# Patient Record
Sex: Male | Born: 1950 | Race: White | Hispanic: No | Marital: Single | Smoking: Never smoker
Health system: Southern US, Community
[De-identification: ages and names within clinical notes are randomized; demographics above are authoritative.]

## PROBLEM LIST (undated history)

## (undated) DIAGNOSIS — M19011 Primary osteoarthritis, right shoulder: Secondary | ICD-10-CM

## (undated) DIAGNOSIS — I10 Essential (primary) hypertension: Secondary | ICD-10-CM

## (undated) DIAGNOSIS — Z87442 Personal history of urinary calculi: Secondary | ICD-10-CM

## (undated) DIAGNOSIS — M1990 Unspecified osteoarthritis, unspecified site: Secondary | ICD-10-CM

## (undated) DIAGNOSIS — K219 Gastro-esophageal reflux disease without esophagitis: Secondary | ICD-10-CM

## (undated) HISTORY — PX: JOINT REPLACEMENT: SHX530

## (undated) HISTORY — PX: OTHER SURGICAL HISTORY: SHX169

---

## 2012-06-12 ENCOUNTER — Other Ambulatory Visit (HOSPITAL_COMMUNITY): Payer: Self-pay | Admitting: Orthopedic Surgery

## 2012-06-12 ENCOUNTER — Ambulatory Visit (HOSPITAL_COMMUNITY)
Admission: RE | Admit: 2012-06-12 | Discharge: 2012-06-12 | Disposition: A | Discharge status: Home / Self Care | Payer: Self-pay | Source: Ambulatory Visit | Attending: Orthopedic Surgery | Admitting: Orthopedic Surgery

## 2012-06-12 DIAGNOSIS — Z1389 Encounter for screening for other disorder: Secondary | ICD-10-CM | POA: Insufficient documentation

## 2012-06-12 DIAGNOSIS — Z77018 Contact with and (suspected) exposure to other hazardous metals: Secondary | ICD-10-CM

## 2017-04-30 ENCOUNTER — Other Ambulatory Visit: Payer: Self-pay | Admitting: Orthopedic Surgery

## 2017-04-30 DIAGNOSIS — M25511 Pain in right shoulder: Secondary | ICD-10-CM

## 2017-05-04 ENCOUNTER — Ambulatory Visit
Admission: RE | Admit: 2017-05-04 | Discharge: 2017-05-04 | Disposition: A | Discharge status: Home / Self Care | Payer: Medicare Other | Source: Ambulatory Visit | Attending: Orthopedic Surgery | Admitting: Orthopedic Surgery

## 2017-05-04 DIAGNOSIS — M25511 Pain in right shoulder: Secondary | ICD-10-CM

## 2017-08-07 ENCOUNTER — Other Ambulatory Visit: Payer: Self-pay | Admitting: Orthopedic Surgery

## 2017-08-14 ENCOUNTER — Inpatient Hospital Stay (HOSPITAL_COMMUNITY): Admission: RE | Admit: 2017-08-14 | Payer: Self-pay | Source: Ambulatory Visit

## 2017-10-02 ENCOUNTER — Encounter (HOSPITAL_COMMUNITY): Payer: Self-pay

## 2017-10-02 ENCOUNTER — Encounter (HOSPITAL_COMMUNITY)
Admission: RE | Admit: 2017-10-02 | Discharge: 2017-10-02 | Disposition: A | Discharge status: Home / Self Care | Payer: BLUE CROSS/BLUE SHIELD | Source: Ambulatory Visit | Attending: Orthopedic Surgery | Admitting: Orthopedic Surgery

## 2017-10-02 ENCOUNTER — Other Ambulatory Visit: Payer: Self-pay

## 2017-10-02 DIAGNOSIS — Z01818 Encounter for other preprocedural examination: Secondary | ICD-10-CM | POA: Diagnosis present

## 2017-10-02 HISTORY — DX: Personal history of urinary calculi: Z87.442

## 2017-10-02 HISTORY — DX: Gastro-esophageal reflux disease without esophagitis: K21.9

## 2017-10-02 HISTORY — DX: Essential (primary) hypertension: I10

## 2017-10-02 HISTORY — DX: Unspecified osteoarthritis, unspecified site: M19.90

## 2017-10-02 LAB — BASIC METABOLIC PANEL
ANION GAP: 9 (ref 5–15)
BUN: 18 mg/dL (ref 6–20)
CO2: 24 mmol/L (ref 22–32)
Calcium: 9.2 mg/dL (ref 8.9–10.3)
Chloride: 105 mmol/L (ref 101–111)
Creatinine, Ser: 1.09 mg/dL (ref 0.61–1.24)
GFR calc Af Amer: >60 mL/min (ref >60)
GLUCOSE: 92 mg/dL (ref 65–99)
POTASSIUM: 4.1 mmol/L (ref 3.5–5.1)
Sodium: 138 mmol/L (ref 135–145)

## 2017-10-02 LAB — CBC
HEMATOCRIT: 42 % (ref 39.0–52.0)
HEMOGLOBIN: 14.3 g/dL (ref 13.0–17.0)
MCH: 29.1 pg (ref 26.0–34.0)
MCHC: 34 g/dL (ref 30.0–36.0)
MCV: 85.5 fL (ref 78.0–100.0)
Platelets: 244 10*3/uL (ref 150–400)
RBC: 4.91 MIL/uL (ref 4.22–5.81)
RDW: 12.2 % (ref 11.5–15.5)
WBC: 10.9 10*3/uL — AB (ref 4.0–10.5)

## 2017-10-02 LAB — SURGICAL PCR SCREEN
MRSA, PCR: NEGATIVE
STAPHYLOCOCCUS AUREUS: NEGATIVE

## 2017-10-02 NOTE — Pre-Procedure Instructions (Signed)
Sergio SpryVernon Jensen  10/02/2017      [facility] [id number] - [place], [place] - [address] AT [facility] [address] [place] [id number] Phone: [phone] Fax: [phone]  [facility] [place] - [address] [place] [id number] Phone: [phone] Fax: [phone]    Your procedure is scheduled on 10-13-2017  Tuesday.  [facility] Admitting at 5:30 A.M .  Call this number if you have problems the morning of surgery:  [phone]   Remember:  Do not eat food or drink liquids after midnight.   Take these medicines the morning of surgery with A SIP OF WATER Nexium if needed  STOP ASPIRIN,ANTIINFLAMATORIES (IBUPROFEN,ALEVE,MOTRIN,ADVIL,GOODY'S POWDERS),HERBAL SUPPLEMENTS,FISH OIL,AND VITAMINS 5-7 DAYS PRIOR TO SURGERY     Do not wear jewelry, make-up or nail polish.  Do not wear lotions, powders, or perfumes, or deoderant.  Do not shave 48 hours prior to surgery.  Men may shave face and neck.  Do not bring valuables to the hospital.  [facility] is not responsible for any belongings or valuables.  Contacts, dentures or bridgework may not be worn into surgery.  Leave your suitcase in the car.  After surgery it may be brought to your room.  For patients admitted to the hospital, discharge time will be determined by your treatment team.  Patients discharged the day of surgery will not be allowed to drive home.    Special Instructions: [place] - Preparing for Surgery  Before surgery, you can play an important role.  Because skin is not sterile, your skin needs to be as free of germs as possible.  You can reduce the number of germs on you skin by washing with CHG (chlorahexidine gluconate) soap before surgery.  CHG is an antiseptic cleaner which kills germs and bonds with the skin to continue killing germs even after washing.  Please DO NOT use if you have  an allergy to CHG or antibacterial soaps.  If your skin becomes reddened/irritated stop using the CHG and inform your nurse when you arrive at Short Stay.  Do not shave (including legs and underarms) for at least 48 hours prior to the first CHG shower.  You may shave your face.  Please follow these instructions carefully:   1.  Shower with CHG Soap the night before surgery and the   morning of Surgery.  2.  If you choose to wash your hair, wash your hair first as usual with your normal shampoo.  3.  After you shampoo, rinse your hair and body thoroughly to remove the  Shampoo.  4.  Use CHG as you would any other liquid soap.  You can apply chg directly  to the skin and wash gently with scrungie or a clean washcloth.  5.  Apply the CHG Soap to your body ONLY FROM THE NECK DOWN.   Do not use on open wounds or open sores.  Avoid contact with your eyes,  ears, mouth and genitals (private parts).  Wash genitals (private parts) with your normal soap.  6.  Wash thoroughly, paying special attention to the area where your surgery will be performed.  7.  Thoroughly rinse your body with warm water from the neck down.  8.  DO NOT shower/wash with your normal soap after using and rinsing o  the CHG Soap.  9.  Pat yourself dry with a clean towel.  10.  Wear clean pajamas.            11.  Place clean sheets on your bed the night of your first shower and do not sleep with pets.  Day of Surgery  Do not apply any lotions/deodorants the morning of surgery.  Please wear clean clothes to the hospital/surgery center.   Please read over the following fact sheets that you were given. Coughing and Deep Breathing, MRSA Information and Surgical Site Infection Prevention,Incentive spirmetry

## 2017-10-02 NOTE — Progress Notes (Signed)
Denies any cardiac history Never seen a cardiologist Denies any cardiac testing

## 2017-10-13 ENCOUNTER — Inpatient Hospital Stay (HOSPITAL_COMMUNITY): Payer: BLUE CROSS/BLUE SHIELD | Admitting: Certified Registered"

## 2017-10-13 ENCOUNTER — Encounter (HOSPITAL_COMMUNITY): Payer: Self-pay | Admitting: *Deleted

## 2017-10-13 ENCOUNTER — Encounter (HOSPITAL_COMMUNITY)
Admission: RE | Disposition: A | Discharge status: Home / Self Care | Payer: Self-pay | Source: Ambulatory Visit | Attending: Orthopedic Surgery

## 2017-10-13 ENCOUNTER — Inpatient Hospital Stay (HOSPITAL_COMMUNITY)
Admission: RE | Admit: 2017-10-13 | Discharge: 2017-10-14 | DRG: 483 | Disposition: A | Discharge status: Home / Self Care | Payer: BLUE CROSS/BLUE SHIELD | Source: Ambulatory Visit | Attending: Orthopedic Surgery | Admitting: Orthopedic Surgery

## 2017-10-13 ENCOUNTER — Inpatient Hospital Stay (HOSPITAL_COMMUNITY): Payer: BLUE CROSS/BLUE SHIELD

## 2017-10-13 DIAGNOSIS — M1990 Unspecified osteoarthritis, unspecified site: Secondary | ICD-10-CM | POA: Diagnosis present

## 2017-10-13 DIAGNOSIS — Z881 Allergy status to other antibiotic agents status: Secondary | ICD-10-CM | POA: Diagnosis not present

## 2017-10-13 DIAGNOSIS — M19019 Primary osteoarthritis, unspecified shoulder: Secondary | ICD-10-CM | POA: Diagnosis present

## 2017-10-13 DIAGNOSIS — M19011 Primary osteoarthritis, right shoulder: Secondary | ICD-10-CM | POA: Diagnosis present

## 2017-10-13 DIAGNOSIS — I10 Essential (primary) hypertension: Secondary | ICD-10-CM | POA: Diagnosis present

## 2017-10-13 DIAGNOSIS — Z87442 Personal history of urinary calculi: Secondary | ICD-10-CM

## 2017-10-13 DIAGNOSIS — Z96619 Presence of unspecified artificial shoulder joint: Secondary | ICD-10-CM

## 2017-10-13 DIAGNOSIS — K219 Gastro-esophageal reflux disease without esophagitis: Secondary | ICD-10-CM | POA: Diagnosis present

## 2017-10-13 DIAGNOSIS — M25511 Pain in right shoulder: Secondary | ICD-10-CM | POA: Diagnosis present

## 2017-10-13 HISTORY — PX: RESECTION DISTAL CLAVICAL: SHX5053

## 2017-10-13 HISTORY — DX: Primary osteoarthritis, right shoulder: M19.011

## 2017-10-13 HISTORY — PX: TOTAL SHOULDER ARTHROPLASTY: SHX126

## 2017-10-13 SURGERY — ARTHROPLASTY, SHOULDER, TOTAL
Anesthesia: General | Laterality: Right

## 2017-10-13 MED ORDER — METOCLOPRAMIDE HCL 5 MG/ML IJ SOLN: 5.0000 mg | Freq: Three times a day (TID) | INTRAMUSCULAR | Status: DC | PRN

## 2017-10-13 MED ORDER — OXYCODONE HCL 5 MG PO TABS: 5.0000 mg | ORAL_TABLET | ORAL | Status: DC | PRN

## 2017-10-13 MED ORDER — MIDAZOLAM HCL 2 MG/2ML IJ SOLN
INTRAMUSCULAR | Status: AC
Start: 2017-10-13 — End: 2017-10-13
  Filled 2017-10-13: qty 2

## 2017-10-13 MED ORDER — SENNA-DOCUSATE SODIUM 8.6-50 MG PO TABS: 2.0000 | ORAL_TABLET | Freq: Every day | ORAL | 1 refills | Status: AC

## 2017-10-13 MED ORDER — PHENOL 1.4 % MT LIQD: 1.0000 | OROMUCOSAL | Status: DC | PRN

## 2017-10-13 MED ORDER — MIDAZOLAM HCL 5 MG/5ML IJ SOLN
INTRAMUSCULAR | Status: DC | PRN
Start: 2017-10-13 — End: 2017-10-13
  Administered 2017-10-13: 2 mg via INTRAVENOUS

## 2017-10-13 MED ORDER — OXYCODONE HCL 5 MG PO TABS
10.0000 mg | ORAL_TABLET | ORAL | Status: DC | PRN
  Administered 2017-10-13 – 2017-10-14 (×3): 10 mg via ORAL
  Filled 2017-10-13 (×3): qty 2

## 2017-10-13 MED ORDER — DEXAMETHASONE SODIUM PHOSPHATE 10 MG/ML IJ SOLN
INTRAMUSCULAR | Status: DC | PRN
  Administered 2017-10-13: 5 mg via INTRAVENOUS

## 2017-10-13 MED ORDER — MENTHOL 3 MG MT LOZG
1.0000 | LOZENGE | OROMUCOSAL | Status: DC | PRN
  Filled 2017-10-13: qty 9

## 2017-10-13 MED ORDER — SUGAMMADEX SODIUM 200 MG/2ML IV SOLN
INTRAVENOUS | Status: AC
Start: 2017-10-13 — End: 2017-10-13
  Filled 2017-10-13: qty 2

## 2017-10-13 MED ORDER — METHOCARBAMOL 500 MG PO TABS: 500.0000 mg | ORAL_TABLET | Freq: Four times a day (QID) | ORAL | Status: DC | PRN

## 2017-10-13 MED ORDER — PROPOFOL 10 MG/ML IV BOLUS
INTRAVENOUS | Status: AC
Start: 2017-10-13 — End: 2017-10-13
  Filled 2017-10-13: qty 20

## 2017-10-13 MED ORDER — ROCURONIUM BROMIDE 10 MG/ML (PF) SYRINGE
PREFILLED_SYRINGE | INTRAVENOUS | Status: AC
  Filled 2017-10-13: qty 5

## 2017-10-13 MED ORDER — BACLOFEN 10 MG PO TABS: 10.0000 mg | ORAL_TABLET | Freq: Three times a day (TID) | ORAL | 0 refills | Status: AC

## 2017-10-13 MED ORDER — ADULT MULTIVITAMIN W/MINERALS CH
1.0000 | ORAL_TABLET | Freq: Every day | ORAL | Status: DC
  Administered 2017-10-13 – 2017-10-14 (×2): 1 via ORAL
  Filled 2017-10-13 (×4): qty 1

## 2017-10-13 MED ORDER — ACETAMINOPHEN 650 MG RE SUPP: 650.0000 mg | RECTAL | Status: DC | PRN

## 2017-10-13 MED ORDER — FENTANYL CITRATE (PF) 250 MCG/5ML IJ SOLN
INTRAMUSCULAR | Status: DC | PRN
  Administered 2017-10-13: 100 ug via INTRAVENOUS
  Administered 2017-10-13: 50 ug via INTRAVENOUS

## 2017-10-13 MED ORDER — ACETAMINOPHEN 325 MG PO TABS: 650.0000 mg | ORAL_TABLET | ORAL | Status: DC | PRN

## 2017-10-13 MED ORDER — IRBESARTAN 300 MG PO TABS
300.0000 mg | ORAL_TABLET | Freq: Every day | ORAL | Status: DC
  Administered 2017-10-14: 300 mg via ORAL
  Filled 2017-10-13 (×2): qty 1

## 2017-10-13 MED ORDER — METHOCARBAMOL 1000 MG/10ML IJ SOLN: 500.0000 mg | Freq: Four times a day (QID) | INTRAVENOUS | Status: DC | PRN

## 2017-10-13 MED ORDER — ONDANSETRON HCL 4 MG/2ML IJ SOLN
INTRAMUSCULAR | Status: AC
  Filled 2017-10-13: qty 2

## 2017-10-13 MED ORDER — PHENYLEPHRINE 40 MCG/ML (10ML) SYRINGE FOR IV PUSH (FOR BLOOD PRESSURE SUPPORT)
PREFILLED_SYRINGE | INTRAVENOUS | Status: AC
  Filled 2017-10-13: qty 20

## 2017-10-13 MED ORDER — HYDROMORPHONE HCL 1 MG/ML IJ SOLN: 0.5000 mg | INTRAMUSCULAR | Status: DC | PRN

## 2017-10-13 MED ORDER — DIPHENHYDRAMINE HCL 12.5 MG/5ML PO ELIX: 12.5000 mg | ORAL_SOLUTION | ORAL | Status: DC | PRN

## 2017-10-13 MED ORDER — DOCUSATE SODIUM 100 MG PO CAPS
100.0000 mg | ORAL_CAPSULE | Freq: Two times a day (BID) | ORAL | Status: DC
  Administered 2017-10-13 – 2017-10-14 (×3): 100 mg via ORAL
  Filled 2017-10-13 (×3): qty 1

## 2017-10-13 MED ORDER — PHENYLEPHRINE HCL 10 MG/ML IJ SOLN
INTRAVENOUS | Status: DC | PRN
  Administered 2017-10-13: 50 ug/min via INTRAVENOUS

## 2017-10-13 MED ORDER — SODIUM CHLORIDE 0.9 % IR SOLN
Status: DC | PRN
  Administered 2017-10-13: 3000 mL

## 2017-10-13 MED ORDER — CEFAZOLIN SODIUM-DEXTROSE 2-4 GM/100ML-% IV SOLN
2.0000 g | INTRAVENOUS | Status: AC
  Administered 2017-10-13: 2 g via INTRAVENOUS
  Filled 2017-10-13: qty 100

## 2017-10-13 MED ORDER — ACETAMINOPHEN 500 MG PO TABS
1000.0000 mg | ORAL_TABLET | Freq: Four times a day (QID) | ORAL | Status: AC
  Administered 2017-10-13 – 2017-10-14 (×4): 1000 mg via ORAL
  Filled 2017-10-13 (×4): qty 2

## 2017-10-13 MED ORDER — SUGAMMADEX SODIUM 200 MG/2ML IV SOLN
INTRAVENOUS | Status: DC | PRN
  Administered 2017-10-13: 200 mg via INTRAVENOUS

## 2017-10-13 MED ORDER — PANTOPRAZOLE SODIUM 40 MG PO PACK
40.0000 mg | PACK | Freq: Every day | ORAL | Status: DC
  Filled 2017-10-13 (×2): qty 20

## 2017-10-13 MED ORDER — ONDANSETRON HCL 4 MG PO TABS: 4.0000 mg | ORAL_TABLET | Freq: Three times a day (TID) | ORAL | 0 refills | Status: AC | PRN

## 2017-10-13 MED ORDER — LYSINE 1000 MG PO TABS: 1000.0000 mg | ORAL_TABLET | Freq: Every day | ORAL | Status: DC

## 2017-10-13 MED ORDER — METOCLOPRAMIDE HCL 5 MG PO TABS: 5.0000 mg | ORAL_TABLET | Freq: Three times a day (TID) | ORAL | Status: DC | PRN

## 2017-10-13 MED ORDER — LIDOCAINE 2% (20 MG/ML) 5 ML SYRINGE
INTRAMUSCULAR | Status: AC
  Filled 2017-10-13: qty 5

## 2017-10-13 MED ORDER — ONDANSETRON HCL 4 MG PO TABS: 4.0000 mg | ORAL_TABLET | Freq: Four times a day (QID) | ORAL | Status: DC | PRN

## 2017-10-13 MED ORDER — LACTATED RINGERS IV SOLN
INTRAVENOUS | Status: DC | PRN
  Administered 2017-10-13 (×2): via INTRAVENOUS

## 2017-10-13 MED ORDER — GLYCOPYRROLATE 0.2 MG/ML IJ SOLN
INTRAMUSCULAR | Status: DC | PRN
  Administered 2017-10-13: 0.2 mg via INTRAVENOUS

## 2017-10-13 MED ORDER — LIDOCAINE 2% (20 MG/ML) 5 ML SYRINGE
INTRAMUSCULAR | Status: DC | PRN
  Administered 2017-10-13: 60 mg via INTRAVENOUS

## 2017-10-13 MED ORDER — ZINC GLUCONATE 30 MG PO TABS: 30.0000 mg | ORAL_TABLET | Freq: Every day | ORAL | Status: DC

## 2017-10-13 MED ORDER — 0.9 % SODIUM CHLORIDE (POUR BTL) OPTIME
TOPICAL | Status: DC | PRN
  Administered 2017-10-13: 1000 mL

## 2017-10-13 MED ORDER — OXYCODONE HCL 5 MG PO TABS: 5.0000 mg | ORAL_TABLET | ORAL | 0 refills | Status: AC | PRN

## 2017-10-13 MED ORDER — ONDANSETRON HCL 4 MG/2ML IJ SOLN
INTRAMUSCULAR | Status: DC | PRN
  Administered 2017-10-13: 4 mg via INTRAVENOUS

## 2017-10-13 MED ORDER — DEXAMETHASONE SODIUM PHOSPHATE 10 MG/ML IJ SOLN
INTRAMUSCULAR | Status: AC
  Filled 2017-10-13: qty 1

## 2017-10-13 MED ORDER — BISACODYL 10 MG RE SUPP
10.0000 mg | Freq: Every day | RECTAL | Status: DC | PRN
Start: 2017-10-13 — End: 2017-10-14

## 2017-10-13 MED ORDER — PROPOFOL 10 MG/ML IV BOLUS
INTRAVENOUS | Status: DC | PRN
  Administered 2017-10-13: 160 mg via INTRAVENOUS

## 2017-10-13 MED ORDER — MAGNESIUM CITRATE PO SOLN: 1.0000 | Freq: Once | ORAL | Status: DC | PRN

## 2017-10-13 MED ORDER — FENTANYL CITRATE (PF) 250 MCG/5ML IJ SOLN
INTRAMUSCULAR | Status: AC
  Filled 2017-10-13: qty 5

## 2017-10-13 MED ORDER — CEFAZOLIN SODIUM-DEXTROSE 1-4 GM/50ML-% IV SOLN
1.0000 g | Freq: Four times a day (QID) | INTRAVENOUS | Status: AC
  Administered 2017-10-13 – 2017-10-14 (×3): 1 g via INTRAVENOUS
  Filled 2017-10-13 (×3): qty 50

## 2017-10-13 MED ORDER — ZOLPIDEM TARTRATE 5 MG PO TABS: 5.0000 mg | ORAL_TABLET | Freq: Every evening | ORAL | Status: DC | PRN

## 2017-10-13 MED ORDER — ONDANSETRON HCL 4 MG/2ML IJ SOLN: 4.0000 mg | Freq: Four times a day (QID) | INTRAMUSCULAR | Status: DC | PRN

## 2017-10-13 MED ORDER — POTASSIUM CHLORIDE IN NACL 20-0.45 MEQ/L-% IV SOLN
INTRAVENOUS | Status: DC
Start: 2017-10-13 — End: 2017-10-14
  Filled 2017-10-13: qty 1000

## 2017-10-13 MED ORDER — POLYETHYLENE GLYCOL 3350 17 G PO PACK: 17.0000 g | PACK | Freq: Every day | ORAL | Status: DC | PRN

## 2017-10-13 MED ORDER — KETOROLAC TROMETHAMINE 15 MG/ML IJ SOLN
7.5000 mg | Freq: Four times a day (QID) | INTRAMUSCULAR | Status: AC
  Administered 2017-10-13 – 2017-10-14 (×4): 7.5 mg via INTRAVENOUS
  Filled 2017-10-13 (×4): qty 1

## 2017-10-13 MED ORDER — SENNA 8.6 MG PO TABS
1.0000 | ORAL_TABLET | Freq: Two times a day (BID) | ORAL | Status: DC
  Administered 2017-10-13 – 2017-10-14 (×3): 8.6 mg via ORAL
  Filled 2017-10-13 (×3): qty 1

## 2017-10-13 MED ORDER — ESTER-C 500-200-60 MG PO TABS: 500.0000 mg | ORAL_TABLET | Freq: Every day | ORAL | Status: DC

## 2017-10-13 MED ORDER — ROCURONIUM BROMIDE 10 MG/ML (PF) SYRINGE
PREFILLED_SYRINGE | INTRAVENOUS | Status: DC | PRN
  Administered 2017-10-13: 10 mg via INTRAVENOUS
  Administered 2017-10-13 (×2): 20 mg via INTRAVENOUS
  Administered 2017-10-13: 50 mg via INTRAVENOUS
  Administered 2017-10-13: 20 mg via INTRAVENOUS
  Administered 2017-10-13: 30 mg via INTRAVENOUS

## 2017-10-13 MED ORDER — ALUM & MAG HYDROXIDE-SIMETH 200-200-20 MG/5ML PO SUSP: 30.0000 mL | ORAL | Status: DC | PRN

## 2017-10-13 SURGICAL SUPPLY — 57 items
BIT DRILL 5/64X5 DISP (BIT) ×3 IMPLANT
BIT DRILL 7/64X5 DISP (BIT) ×3 IMPLANT
BLADE SAGITTAL (BLADE) ×2
BLADE SAW SAG 9X24 (BLADE) ×3 IMPLANT
BLADE SAW THK.89X75X18XSGTL (BLADE) ×1 IMPLANT
CAPT SHLDR TOTAL 2 ×3 IMPLANT
CEMENT BONE R 1X40 (Cement) ×3 IMPLANT
CLOSURE STERI-STRIP 1/2X4 (GAUZE/BANDAGES/DRESSINGS)
CLOSURE STERI-STRIP 1/4X4 (GAUZE/BANDAGES/DRESSINGS) ×3 IMPLANT
CLSR STERI-STRIP ANTIMIC 1/2X4 (GAUZE/BANDAGES/DRESSINGS) IMPLANT
COVER SURGICAL LIGHT HANDLE (MISCELLANEOUS) ×3 IMPLANT
DRAPE HALF SHEET 40X57 (DRAPES) ×3 IMPLANT
DRAPE ORTHO SPLIT 77X108 STRL (DRAPES) ×4
DRAPE SURG ORHT 6 SPLT 77X108 (DRAPES) ×2 IMPLANT
DRAPE U-SHAPE 47X51 STRL (DRAPES) ×3 IMPLANT
DRSG MEPILEX BORDER 4X8 (GAUZE/BANDAGES/DRESSINGS) ×3 IMPLANT
DRSG MEPITEL 4X7.2 (GAUZE/BANDAGES/DRESSINGS) ×3 IMPLANT
DURAPREP 26ML APPLICATOR (WOUND CARE) ×3 IMPLANT
ELECT REM PT RETURN 9FT ADLT (ELECTROSURGICAL) ×3
ELECTRODE REM PT RTRN 9FT ADLT (ELECTROSURGICAL) ×1 IMPLANT
GLOVE BIOGEL PI ORTHO PRO SZ8 (GLOVE) ×4
GLOVE ORTHO TXT STRL SZ7.5 (GLOVE) ×3 IMPLANT
GLOVE PI ORTHO PRO STRL SZ8 (GLOVE) ×2 IMPLANT
GLOVE SURG ORTHO 8.0 STRL STRW (GLOVE) ×3 IMPLANT
GOWN STRL REUS W/ TWL XL LVL3 (GOWN DISPOSABLE) ×1 IMPLANT
GOWN STRL REUS W/TWL 2XL LVL3 (GOWN DISPOSABLE) ×3 IMPLANT
GOWN STRL REUS W/TWL XL LVL3 (GOWN DISPOSABLE) ×2
HANDPIECE INTERPULSE COAX TIP (DISPOSABLE) ×2
HOOD PEEL AWAY FACE SHEILD DIS (HOOD) ×6 IMPLANT
HOOD PEEL AWAY FLYTE STAYCOOL (MISCELLANEOUS) ×3 IMPLANT
KIT BASIN OR (CUSTOM PROCEDURE TRAY) ×3 IMPLANT
KIT ROOM TURNOVER OR (KITS) ×3 IMPLANT
MANIFOLD NEPTUNE II (INSTRUMENTS) ×3 IMPLANT
NS IRRIG 1000ML POUR BTL (IV SOLUTION) ×3 IMPLANT
PACK SHOULDER (CUSTOM PROCEDURE TRAY) ×3 IMPLANT
PAD ARMBOARD 7.5X6 YLW CONV (MISCELLANEOUS) ×6 IMPLANT
PIN HUMERAL STMN 3.2MMX9IN (INSTRUMENTS) ×3 IMPLANT
SET HNDPC FAN SPRY TIP SCT (DISPOSABLE) ×1 IMPLANT
SLING ARM FOAM STRAP XLG (SOFTGOODS) ×3 IMPLANT
SLING ARM IMMOBILIZER LRG (SOFTGOODS) IMPLANT
SLING ARM IMMOBILIZER MED (SOFTGOODS) IMPLANT
SMARTMIX MINI TOWER (MISCELLANEOUS) ×3
SUCTION FRAZIER HANDLE 10FR (MISCELLANEOUS) ×2
SUCTION TUBE FRAZIER 10FR DISP (MISCELLANEOUS) ×1 IMPLANT
SUPPORT WRAP ARM LG (MISCELLANEOUS) ×3 IMPLANT
SUT FIBERWIRE #2 38 REV NDL BL (SUTURE) ×15
SUT VIC AB 0 CT1 27 (SUTURE) ×2
SUT VIC AB 0 CT1 27XBRD ANBCTR (SUTURE) ×1 IMPLANT
SUT VIC AB 2-0 CT1 27 (SUTURE) ×2
SUT VIC AB 2-0 CT1 TAPERPNT 27 (SUTURE) ×1 IMPLANT
SUT VIC AB 3-0 SH 8-18 (SUTURE) ×3 IMPLANT
SUTURE FIBERWR#2 38 REV NDL BL (SUTURE) ×5 IMPLANT
TOWER CARTRIDGE SMART MIX (DISPOSABLE) ×3 IMPLANT
TOWER SMARTMIX MINI (MISCELLANEOUS) ×1 IMPLANT
TUBE CONNECTING 12'X1/4 (SUCTIONS)
TUBE CONNECTING 12X1/4 (SUCTIONS) IMPLANT
YANKAUER SUCT BULB TIP NO VENT (SUCTIONS) ×3 IMPLANT

## 2017-10-13 NOTE — Anesthesia Procedure Notes (Signed)
Procedure Name: Intubation Date/Time: 10/13/2017 7:44 AM Performed by: Freddie Breech, CRNA Pre-anesthesia Checklist: Patient identified, Emergency Drugs available, Suction available and Patient being monitored Patient Re-evaluated:Patient Re-evaluated prior to induction Oxygen Delivery Method: Circle System Utilized Preoxygenation: Pre-oxygenation with 100% oxygen Induction Type: IV induction Ventilation: Mask ventilation without difficulty Laryngoscope Size: Mac and 4 Grade View: Grade III Tube type: Oral Tube size: 7.5 mm Number of attempts: 1 Airway Equipment and Method: Stylet and Oral airway Placement Confirmation: ETT inserted through vocal cords under direct vision,  positive ETCO2 and breath sounds checked- equal and bilateral Secured at: 22 cm Tube secured with: Tape Dental Injury: Teeth and Oropharynx as per pre-operative assessment

## 2017-10-13 NOTE — Anesthesia Preprocedure Evaluation (Signed)
Anesthesia Evaluation  Patient identified by MRN, date of birth, ID band Patient awake    Reviewed: Allergy & Precautions, NPO status , Patient's Chart, lab work & pertinent test results  Airway Mallampati: I  TM Distance: >3 FB Neck ROM: Full    Dental   Pulmonary    Pulmonary exam normal        Cardiovascular hypertension, Pt. on medications Normal cardiovascular exam     Neuro/Psych    GI/Hepatic GERD  Medicated and Controlled,  Endo/Other    Renal/GU      Musculoskeletal   Abdominal   Peds  Hematology   Anesthesia Other Findings   Reproductive/Obstetrics                             Anesthesia Physical Anesthesia Plan  ASA: II  Anesthesia Plan: General   Post-op Pain Management:  Regional for Post-op pain   Induction: Intravenous  PONV Risk Score and Plan: 2 and Dexamethasone, Ondansetron and Midazolam  Airway Management Planned: Oral ETT  Additional Equipment:   Intra-op Plan:   Post-operative Plan: Extubation in OR  Informed Consent: I have reviewed the patients History and Physical, chart, labs and discussed the procedure including the risks, benefits and alternatives for the proposed anesthesia with the patient or authorized representative who has indicated his/her understanding and acceptance.     Plan Discussed with: Surgeon and CRNA  Anesthesia Plan Comments:         Anesthesia Quick Evaluation

## 2017-10-13 NOTE — Evaluation (Signed)
Physical Therapy Evaluation and discharge  Patient Details Name: Sergio SpryVernon Prichett MRN: [id number] DOB: Mar 31, 1951 Today's Date: 10/13/2017   History of Present Illness  Pt is a 66 y/o male s/p R total shoulder arthroplasty. PMH includes HTN and L TKA.   Clinical Impression  Patient evaluated by Physical Therapy with no further acute PT needs identified. All education has been completed and the patient has no further questions. Pt steady with gait and stair navigation and required supervision. Will have support from family at home. Follow up recommendations per MD arrangements. See below for any follow-up Physical Therapy or equipment needs. PT is signing off. Thank you for this referral. If needs change, please re-consult.      Follow Up Recommendations DC plan and follow up therapy as arranged by surgeon    Equipment Recommendations  None recommended by PT    Recommendations for Other Services       Precautions / Restrictions Precautions Precautions: Shoulder Type of Shoulder Precautions: total shoulder  Shoulder Interventions: Shoulder sling/immobilizer;Off for dressing/bathing/exercises Precaution Booklet Issued: No Precaution Comments: No PROM/AROM at shoulder  Required Braces or Orthoses: Sling Restrictions Weight Bearing Restrictions: Yes RUE Weight Bearing: Non weight bearing      Mobility  Bed Mobility Overal bed mobility: Modified Independent             General bed mobility comments: Increased time, but no assist required.   Transfers Overall transfer level: Modified independent Equipment used: None             General transfer comment: Steady transfer. No assist requried. Did require safety cues to wait for PT.   Ambulation/Gait Ambulation/Gait assistance: Supervision Ambulation Distance (Feet): 400 Feet Assistive device: None Gait Pattern/deviations: WFL(Within Functional Limits) Gait velocity: WFL Gait velocity interpretation: at or above  normal speed for age/gender General Gait Details: Overall steady gait. Cues to slow gait speed to increase safety with mobility. No LOB noted.   Stairs Stairs: Yes Stairs assistance: Supervision Stair Management: One rail Right;Step to pattern;Forwards Number of Stairs: 1 General stair comments: Practiced 1 step management X 3. Supervision for safety. Educated about assist required at home to increase safety. Pt felt safe with stair navigation.   Wheelchair Mobility    Modified Rankin (Stroke Patients Only)       Balance Overall balance assessment: Needs assistance Sitting-balance support: No upper extremity supported;Feet supported Sitting balance-Leahy Scale: Good     Standing balance support: During functional activity;No upper extremity supported Standing balance-Leahy Scale: Good                               Pertinent Vitals/Pain Pain Assessment: Faces Faces Pain Scale: Hurts a little bit Pain Location: R shoulder  Pain Descriptors / Indicators: Aching;Operative site guarding Pain Intervention(s): Limited activity within patient's tolerance;Monitored during session;Repositioned    Home Living Family/patient expects to be discharged to:: Private residence Living Arrangements: Spouse/significant other Available Help at Discharge: Family;Available 24 hours/day Type of Home: House Home Access: Stairs to enter Entrance Stairs-Rails: None Entrance Stairs-Number of Steps: 1 Home Layout: Two level;Laundry or work area in basement;Able to live on main level with bedroom/bathroom Home Equipment: Environmental consultantWalker - 2 wheels;Crutches      Prior Function Level of Independence: Independent               Hand Dominance        Extremity/Trunk Assessment   Upper Extremity Assessment Upper Extremity Assessment:  RUE deficits/detail RUE Deficits / Details: RUE in sling throughout session. Able to wiggle fingers and wrist.     Lower Extremity Assessment Lower  Extremity Assessment: Overall WFL for tasks assessed    Cervical / Trunk Assessment Cervical / Trunk Assessment: Normal  Communication   Communication: No difficulties  Cognition Arousal/Alertness: Awake/alert Behavior During Therapy: WFL for tasks assessed/performed Overall Cognitive Status: Within Functional Limits for tasks assessed                                        General Comments General comments (skin integrity, edema, etc.): Educated about moving fingers and wrist for exercise program. Educated about pacing himself at home to increase safety with mobility.     Exercises     Assessment/Plan    PT Assessment Patent does not need any further PT services  PT Problem List         PT Treatment Interventions      PT Goals (Current goals can be found in the Care Plan section)  Acute Rehab PT Goals Patient Stated Goal: to go home  PT Goal Formulation: With patient/family Time For Goal Achievement: 10/13/17 Potential to Achieve Goals: Good    Frequency     Barriers to discharge        Co-evaluation               AM-PAC PT "6 Clicks" Daily Activity  Outcome Measure Difficulty turning over in bed (including adjusting bedclothes, sheets and blankets)?: None Difficulty moving from lying on back to sitting on the side of the bed? : None Difficulty sitting down on and standing up from a chair with arms (e.g., wheelchair, bedside commode, etc,.)?: None Help needed moving to and from a bed to chair (including a wheelchair)?: None Help needed walking in hospital room?: None Help needed climbing 3-5 steps with a railing? : None 6 Click Score: 24    End of Session Equipment Utilized During Treatment: Gait belt;Other (comment)(sling RUE ) Activity Tolerance: Patient tolerated treatment well Patient left: with family/visitor present;Other (comment)(walking with family; cleared with RN ) Nurse Communication: Mobility status(cleared for walking with  family ) PT Visit Diagnosis: Other abnormalities of gait and mobility (R26.89);Pain Pain - Right/Left: Right Pain - part of body: Shoulder    Time: [id number] PT Time Calculation (min) (ACUTE ONLY): 25 min   Charges:   PT Evaluation $PT Eval Low Complexity: 1 Low PT Treatments $Gait Training: 8-22 mins   PT G Codes:        Gladys DammeBrittany Izamar Linden, PT, DPT  Acute Rehabilitation Services  Pager: [phone]   Lehman PromBrittany S Dallas Torok 10/13/2017, 3:44 PM

## 2017-10-13 NOTE — Progress Notes (Signed)
PHARMACIST - PHYSICIAN ORDER COMMUNICATION  CONCERNING: P&T Medication Policy on Herbal Medications  DESCRIPTION:  This patient's order for:  Lysine 1000 mg, Ester-C, Zinc gluconate 30 mg has been noted.  This product(s) is classified as an "herbal" or natural product. Due to a lack of definitive safety studies or FDA approval, nonstandard manufacturing practices, plus the potential risk of unknown drug-drug interactions while on inpatient medications, the Pharmacy and Therapeutics Committee does not permit the use of "herbal" or natural products of this type within [facility].   ACTION TAKEN: The pharmacy department is unable to verify this order at this time and your patient has been informed of this safety policy. Please reevaluate patient's clinical condition at discharge and address if the herbal or natural product(s) should be resumed at that time.

## 2017-10-13 NOTE — Anesthesia Postprocedure Evaluation (Signed)
Anesthesia Post Note  Patient: Melanee SpryVernon Shave  Procedure(s) Performed: RIGHT TOTAL SHOULDER ARTHROPLASTY (Right ) RESECTION DISTAL CLAVICAL (Right )     Patient location during evaluation: PACU Anesthesia Type: General Level of consciousness: awake and alert Pain management: pain level controlled Vital Signs Assessment: post-procedure vital signs reviewed and stable Respiratory status: spontaneous breathing, nonlabored ventilation, respiratory function stable and patient connected to nasal cannula oxygen Cardiovascular status: blood pressure returned to baseline and stable Postop Assessment: no apparent nausea or vomiting Anesthetic complications: no    Last Vitals:  Vitals:   10/13/17 1140 10/13/17 1204  BP: (!) 108/59 114/67  Pulse: 61 (!) 54  Resp: 17 16  Temp: 36.6 C 36.6 C  SpO2: 100% 99%    Last Pain:  Vitals:   10/13/17 1315  TempSrc:   PainSc: 4                  Elinda Bunten DAVID

## 2017-10-13 NOTE — Plan of Care (Signed)
  Completed/Met Education: Knowledge of the prescribed therapeutic regimen will improve 10/13/2017 2013 - Completed/Met by Charlena Cross, RN Understanding of activity limitations/precautions following surgery will improve 10/13/2017 2013 - Completed/Met by Pricilla Holm, July D, RN Pain Management: Pain level will decrease with appropriate interventions 10/13/2017 2013 - Completed/Met by Charlena Cross, RN

## 2017-10-13 NOTE — H&P (Signed)
PREOPERATIVE H&P  Chief Complaint: djd right shoulder  HPI: Sergio Jensen is a 66 y.o. male who presents for preoperative history and physical with a diagnosis of djd right shoulder. Symptoms are rated as moderate to severe, and have been worsening.  This is significantly impairing activities of daily living.  He has elected for surgical management.   He has failed injections, activity modification, anti-inflammatories, and assistive devices.  Preoperative X-rays demonstrate end stage degenerative changes with osteophyte formation, loss of joint space, subchondral sclerosis.  He has also localized significant pain over his acromioclavicular joint.  Past Medical History:  Diagnosis Date  . Arthritis   . GERD (gastroesophageal reflux disease)   . History of kidney stones   . Hypertension    Past Surgical History:  Procedure Laterality Date  . JOINT REPLACEMENT Left    knee  . penile implsnt     7 weeks ago   Social History   Socioeconomic History  . Marital status: Single    Spouse name: None  . Number of children: None  . Years of education: None  . Highest education level: None  Social Needs  . Financial resource strain: None  . Food insecurity - worry: None  . Food insecurity - inability: None  . Transportation needs - medical: None  . Transportation needs - non-medical: None  Occupational History  . None  Tobacco Use  . Smoking status: Never Smoker  . Smokeless tobacco: Never Used  Substance and Sexual Activity  . Alcohol use: Yes    Comment: rarely  . Drug use: No  . Sexual activity: None  Other Topics Concern  . None  Social History Narrative  . None   History reviewed. No pertinent family history. Allergies  Allergen Reactions  . Erythromycin Other (See Comments)    Severe stomach cramps   Prior to Admission medications   Medication Sig Start Date End Date Taking? Authorizing Provider  Bioflavonoid Products (ESTER-C) 500-200-60 MG TABS Take 500 mg  by mouth daily.   Yes [provider]  esomeprazole (NEXIUM) 40 MG capsule Take 40 mg by mouth daily as needed.   Yes [provider]  irbesartan (AVAPRO) 300 MG tablet Take 300 mg by mouth daily.   Yes [provider]  Lysine 1000 MG TABS Take 1,000 mg by mouth daily.   Yes [provider]  Multiple Vitamins-Minerals (MULTIVITAMIN WITH MINERALS) tablet Take 1 tablet by mouth daily. Thrive   Yes [provider]  naproxen sodium (ALEVE) 220 MG tablet Take 440 mg by mouth at bedtime as needed (for 3 consecutive nights).   Yes [provider]  testosterone cypionate (DEPOTESTOTERONE CYPIONATE) 100 MG/ML injection Inject 200 mg into the muscle every 14 (fourteen) days. For IM use only   Yes [provider]  Zinc Gluconate 30 MG TABS Take 30 mg by mouth daily.   Yes [provider]     Positive ROS: All other systems have been reviewed and were otherwise negative with the exception of those mentioned in the HPI and as above.  Physical Exam: General: Alert, no acute distress Cardiovascular: No pedal edema Respiratory: No cyanosis, no use of accessory musculature GI: No organomegaly, abdomen is soft and non-tender Skin: No lesions in the area of chief complaint Neurologic: Sensation intact distally Psychiatric: Patient is competent for consent with normal mood and affect Lymphatic: No axillary or cervical lymphadenopathy  MUSCULOSKELETAL: Right shoulder has active motion 0-120 degrees with positive pain over his acromioclavicular joint.  Assessment: djd right shoulder   Plan: Plan for Procedure(s): RIGHT TOTAL SHOULDER ARTHROPLASTY RESECTION DISTAL CLAVICAL  The risks benefits and alternatives were discussed with the patient including but not limited to the risks of nonoperative treatment, versus surgical intervention including infection, bleeding, nerve injury,  blood clots, cardiopulmonary complications, morbidity,  mortality, among others, and they were willing to proceed.   Eulas PostJoshua P Willy Vorce, MD Cell [phone]   10/13/2017 7:12 AM

## 2017-10-13 NOTE — Discharge Instructions (Signed)

## 2017-10-13 NOTE — Transfer of Care (Signed)
Immediate Anesthesia Transfer of Care Note  Patient: Sergio Jensen  Procedure(s) Performed: RIGHT TOTAL SHOULDER ARTHROPLASTY (Right ) RESECTION DISTAL CLAVICAL (Right )  Patient Location: PACU  Anesthesia Type:GA combined with regional for post-op pain  Level of Consciousness: drowsy and patient cooperative  Airway & Oxygen Therapy: Patient Spontanous Breathing and Patient connected to face mask oxygen  Post-op Assessment: Report given to RN and Post -op Vital signs reviewed and stable  Post vital signs: Reviewed and stable  Last Vitals:  Vitals:   10/13/17 0540 10/13/17 1053  BP: (!) 172/83 (!) 126/47  Pulse: 60 64  Resp: 20 20  Temp: 36.6 C 36.6 C  SpO2: 97% 100%    Last Pain:  Vitals:   10/13/17 0540  TempSrc: Oral      Patients Stated Pain Goal: 4 (10/13/17 0551)  Complications: No apparent anesthesia complications

## 2017-10-13 NOTE — Op Note (Signed)
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  POST-OPERATIVE DIAGNOSIS:  Same  PROCEDURE:  RIGHT TOTAL SHOULDER ARTHROPLASTY, with open resection of distal clavicle  SURGEON:  Eulas PostJoshua P Lliam Hoh, MD  PHYSICIAN ASSISTANT: Janace LittenBrandon Parry, OPA-C, present and scrubbed throughout the case, critical for completion in a timely fashion, and for retraction, instrumentation, and closure.  ANESTHESIA:   General with an interscalene block  ESTIMATED BLOOD LOSS: 250 ml  PREOPERATIVE INDICATIONS:  Sergio Jensen is a  66 y.o. male with a diagnosis of djd right shoulder, including both the glenohumeral and acromioclavicular joint with pain in both locations, who failed conservative measures and elected for surgical management.    The risks benefits and alternatives were discussed with the patient preoperatively including but not limited to the risks of infection, bleeding, nerve injury, cardiopulmonary complications, the need for revision surgery, dislocation, loosening, incomplete relief of pain, among others, and the patient was willing to proceed.   OPERATIVE IMPLANTS: Biomet size 12 micro press-fit humeral stem, size 46+18 versa-dial humeral head, set in the B position with increased coverage posteriorly, with a medium cemented glenoid polyethylene 3 peg implant with a central regenerex noncemented post.   OPERATIVE FINDINGS: Advanced glenohumeral osteoarthritis involving the glenoid and the humeral head with substantial osteophyte formation inferiorly.  He also had cystic changes and advanced arthritic disease in his acromioclavicular joint.  Subscapularis was intact.  Rotator cuff is intact.   OPERATIVE PROCEDURE: The patient was brought to the operating room and placed in the supine position. General anesthesia was administered. IV antibiotics were given.   The upper extremity was prepped and draped in usual sterile fashion. The patient was in a beachchair position with all bony prominences padded.   Time out was performed and a deltopectoral approach was carried out.  I placed the incision slightly lateral to my normal incision bringing it up over the acromioclavicular joint.  I exposed the acromioclavicular joint with an in-line incision over the joint through the fascia, and then used an oscillating saw to remove 10 mm of distal clavicular bone.  I then repaired the fascia with 0 Vicryl after irrigating it copiously.  I then went back to the deltopectoral interval more inferiorly, the biceps tendon was tenodesed to the pectoralis tendon. The subscapularis was released, tagging it with a #2 FiberWire, leaving a cuff of tendon for repair.   The inferior osteophyte was removed, and release of the capsule off of the humeral side was completed. The head was dislocated, and I reamed sequentially. I placed the humeral cutting guide at 30 of retroversion, and then pinned this into place, and made my humeral neck cut. This was at the appropriate level.   I then placed deep retractors and exposed the glenoid. I excised the labrum circumferentially, taking care to protect the axillary nerve inferiorly.   I then placed a guidewire into the center position, controlling appropriate version and inclination. I then reamed over the guidewire with the small reamer, and was satisfied with the preparation. I preserved the subchondral bone in order to maximize the strength and minimize the risk for subsequent subsidence.   I then drilled the central hole for the regenerex peg, and then placed the guide, and then drilled the 3 peripheral peg holes. I had excellent bony circumferential contact. The anterior hole was bicortical, and all other holes were contained within the vault.  I then cleaned the glenoid, irrigated it copiously, and then dried it and cemented the  prosthesis into place. Excellent seating was achieved. I had full exposure. The cement cured while I turned my attention to the humeral side.   I sequentially broached, up to the selected size, with the broach set at 30 of retroversion. I placed 3 #2 Fiberwire through the bone for subsequent repair.  I then placed the real stem. I trialed with multiple heads, and the above-named component was selected. Increased posterior coverage improved the coverage. The soft tissue tension was appropriate.   I then impacted the real humeral head into place, reduced the head, and irrigated copiously. Excellent stability and range of motion was achieved. I repaired the subscapularis with a total of 5 #2 FiberWire; one for the interval, one for the corner, and then the remaining three from the lesser tuberosity which had already been passed.  Excellent repair achieved and I irrigated copiously once more. The subcutaneous tissue was closed with Vicryl including the deltopectoral fascia.   The skin was closed with Steri-Strips and sterile gauze was applied. He had a preoperative nerve block. He tolerated the procedure well and there were no complications.

## 2017-10-13 NOTE — Anesthesia Procedure Notes (Signed)
Performed by: Lorenza Winkleman Dane, CRNA       

## 2017-10-14 ENCOUNTER — Encounter (HOSPITAL_COMMUNITY): Payer: Self-pay | Admitting: Orthopedic Surgery

## 2017-10-14 LAB — CBC
HEMATOCRIT: 34.5 % — AB (ref 39.0–52.0)
HEMOGLOBIN: 11.6 g/dL — AB (ref 13.0–17.0)
MCH: 28.9 pg (ref 26.0–34.0)
MCHC: 33.6 g/dL (ref 30.0–36.0)
MCV: 86 fL (ref 78.0–100.0)
Platelets: 172 10*3/uL (ref 150–400)
RBC: 4.01 MIL/uL — ABNORMAL LOW (ref 4.22–5.81)
RDW: 12.4 % (ref 11.5–15.5)
WBC: 14.6 10*3/uL — AB (ref 4.0–10.5)

## 2017-10-14 LAB — BASIC METABOLIC PANEL
ANION GAP: 8 (ref 5–15)
BUN: 27 mg/dL — ABNORMAL HIGH (ref 6–20)
CALCIUM: 8.4 mg/dL — AB (ref 8.9–10.3)
CO2: 25 mmol/L (ref 22–32)
Chloride: 103 mmol/L (ref 101–111)
Creatinine, Ser: 1.3 mg/dL — ABNORMAL HIGH (ref 0.61–1.24)
GFR calc non Af Amer: 56 mL/min — ABNORMAL LOW (ref >60)
Glucose, Bld: 123 mg/dL — ABNORMAL HIGH (ref 65–99)
Potassium: 3.9 mmol/L (ref 3.5–5.1)
Sodium: 136 mmol/L (ref 135–145)

## 2017-10-14 NOTE — Evaluation (Signed)
Occupational Therapy Evaluation Patient Details Name: Sergio Jensen MRN: [id number] DOB: 1950/12/29 Today's Date: 10/14/2017    History of Present Illness Pt is a 66 y/o male s/p R total shoulder arthroplasty. PMH includes HTN and L TKA.    Clinical Impression   This 66 y/o M presents with the above. At baseline Pt is independent with ADLs and functional mobility. Pt reports he will be staying with his fiance' for the first two weeks after discharge for assistance with ADLs PRN. Pt completed room level functional mobility with supervision this session, requires MinA for UB ADLs secondary to RUE functional limitations. Reviewed and educated Pt on shoulder protocol, compensatory techniques for completing ADLs while adhering to precautions with Pt verbalizing and return demonstrating understanding. Feel Pt will safely return home with available assist from caregiver PRN until follow-up as arranged by MD. Questions answered throughout. No further acute OT needs identified at this time. Will sign off.     Follow Up Recommendations  DC plan and follow up therapy as arranged by surgeon;Supervision/Assistance - 24 hour    Equipment Recommendations  None recommended by OT           Precautions / Restrictions Precautions Precautions: Shoulder Type of Shoulder Precautions: total shoulder; conservative protocol: NWB RUE, No PROM/AROM at shoulder, okay for AROM to elbow, wrist and hand (to tolerance) Shoulder Interventions: Shoulder sling/immobilizer;Off for dressing/bathing/exercises Precaution Booklet Issued: Yes (comment) Precaution Comments: handout issued and reviewed Required Braces or Orthoses: Sling Restrictions Weight Bearing Restrictions: Yes RUE Weight Bearing: Non weight bearing      Mobility Bed Mobility               General bed mobility comments: OOB in recliner upon arrival   Transfers Overall transfer level: Modified independent Equipment used: None              General transfer comment: Steady transfer. No assist required.     Balance Overall balance assessment: Needs assistance Sitting-balance support: No upper extremity supported;Feet supported Sitting balance-Leahy Scale: Good     Standing balance support: During functional activity;No upper extremity supported Standing balance-Leahy Scale: Good                             ADL either performed or assessed with clinical judgement   ADL Overall ADL's : Needs assistance/impaired Eating/Feeding: Set up;Sitting   Grooming: Set up;Sitting   Upper Body Bathing: Minimal assistance;Sitting Upper Body Bathing Details (indicate cue type and reason): educated on positioning of UE during bathing and method for washing under RUE; educated on safety during task completion Lower Body Bathing: Min guard;Sit to/from stand   Upper Body Dressing : Minimal assistance;Sitting;Cueing for UE precautions;Cueing for sequencing Upper Body Dressing Details (indicate cue type and reason): educated on proper technique for donning overhead and button up shirt; educated Pt on how to don sling without assist with Pt return demonstrating with minA and verbal cues for safety;adhering to shoulder precautions  Lower Body Dressing: Minimal assistance;Sit to/from stand   Toilet Transfer: Min guard;Ambulation;Regular Teacher, adult education Details (indicate cue type and reason): simulated in transfer to/from recliner  Toileting- Architect and Hygiene: Min guard;Sit to/from stand       Functional mobility during ADLs: Min guard General ADL Comments: edcuated Pt on shoulder precautions, compensatory techniques for completing ADLs while adhering to precautions, and HEP for elbow, wrist and digits; min verbal cues for safety/adherence to shoulder precautions during session  with Pt demonstrating good carryover and understanding                          Pertinent Vitals/Pain Pain Assessment:  Faces Faces Pain Scale: Hurts a little bit Pain Location: R shoulder  Pain Descriptors / Indicators: Aching;Operative site guarding Pain Intervention(s): Monitored during session;Limited activity within patient's tolerance     Hand Dominance Right   Extremity/Trunk Assessment Upper Extremity Assessment Upper Extremity Assessment: RUE deficits/detail RUE Deficits / Details: s/p R TSA; elbow, wrist, and digit ROM WFL    Lower Extremity Assessment Lower Extremity Assessment: Overall WFL for tasks assessed   Cervical / Trunk Assessment Cervical / Trunk Assessment: Normal   Communication Communication Communication: No difficulties   Cognition Arousal/Alertness: Awake/alert Behavior During Therapy: WFL for tasks assessed/performed Overall Cognitive Status: Within Functional Limits for tasks assessed                                           Exercises Shoulder Exercises Elbow Flexion: AROM;Right;10 reps;Standing Elbow Extension: AROM;Right;10 reps;Standing Wrist Flexion: AROM;10 reps;Right;Standing Wrist Extension: AROM;10 reps;Right;Standing Digit Composite Flexion: AROM;10 reps;Right;Standing Composite Extension: AROM;10 reps;Right;Standing Neck Flexion: AROM;Seated Neck Extension: AROM;Seated Neck Lateral Flexion - Right: AROM;Seated Neck Lateral Flexion - Left: AROM;Seated Hand Exercises Forearm Supination: AROM;10 reps;Standing;Right Forearm Pronation: AROM;10 reps;Standing;Right   Shoulder Instructions Shoulder Instructions Donning/doffing shirt without moving shoulder: Minimal assistance;Patient able to independently direct caregiver Method for sponge bathing under operated UE: Min-guard;Patient able to independently direct caregiver Donning/doffing sling/immobilizer: Minimal assistance;Patient able to independently direct caregiver Correct positioning of sling/immobilizer: Min-guard ROM for elbow, wrist and digits of operated UE: Min-guard Sling  wearing schedule (on at all times/off for ADL's): Independent Proper positioning of operated UE when showering: Supervision/safety Positioning of UE while sleeping: Min-guard;Patient able to independently direct caregiver    Home Living Family/patient expects to be discharged to:: Private residence Living Arrangements: Spouse/significant other(fiance') Available Help at Discharge: Family;Available 24 hours/day(for the first week ) Type of Home: House Home Access: Stairs to enter Entergy CorporationEntrance Stairs-Number of Steps: 1 Entrance Stairs-Rails: None Home Layout: Two level;Laundry or work area in basement;Able to live on main level with bedroom/bathroom     Bathroom Shower/Tub: Psychologist, counsellingWalk-in shower;Tub/shower unit   Bathroom Toilet: Handicapped height     Home Equipment: Environmental consultantWalker - 2 wheels;Crutches   Additional Comments: Pt's will be staying in fiance's home for the first two weeks; Pt's reports his fiance' is an occupational therapist and will be available to assist with ADLs PRN after discharge      Prior Functioning/Environment Level of Independence: Independent                 OT Problem List: Decreased knowledge of precautions;Pain;Decreased range of motion;Impaired UE functional use            OT Goals(Current goals can be found in the care plan section) Acute Rehab OT Goals Patient Stated Goal: to go home  OT Goal Formulation: All assessment and education complete, DC therapy                                 AM-PAC PT "6 Clicks" Daily Activity     Outcome Measure Help from another person eating meals?: None Help from another person taking care of personal grooming?: None Help  from another person toileting, which includes using toliet, bedpan, or urinal?: None Help from another person bathing (including washing, rinsing, drying)?: A Little Help from another person to put on and taking off regular upper body clothing?: A Little Help from another person to put on and  taking off regular lower body clothing?: A Little 6 Click Score: 21   End of Session Equipment Utilized During Treatment: Other (comment)(sling ) Nurse Communication: Mobility status  Activity Tolerance: Patient tolerated treatment well Patient left: in chair;with call bell/phone within reach  OT Visit Diagnosis: Muscle weakness (generalized) (M62.81);Other (comment)(s/p R TSA)                Time: [id number] OT Time Calculation (min): 24 min Charges:  OT General Charges $OT Visit: 1 Visit OT Evaluation $OT Eval Low Complexity: 1 Low G-Codes:     Marcy SirenBreanna Raygen Linquist, OT Pager [phone] 10/14/2017   Orlando PennerBreanna L Lazariah Savard 10/14/2017, 8:44 AM

## 2017-10-14 NOTE — Progress Notes (Signed)
Pt doing well. Pt and friend given D/C instructions with Rx's, verbal understanding was provided. Pt's incision is clean and dry with no sign of infection. Pt's IV was removed prior to D/C. Pt D/C'd home via walking per MD order. Pt is stable @ D/C and has no other needs at this time. Rema FendtAshley Blondie Riggsbee, RN

## 2017-10-14 NOTE — Discharge Summary (Addendum)
Physician Discharge Summary  Patient ID: Sergio Jensen MRN: [id number] DOB/AGE: 07-01-1951 66 y.o.  Admit date: 10/13/2017 Discharge date: 10/14/2017  Admission Diagnoses:  Primary localized osteoarthrosis of right shoulder  Discharge Diagnoses:  Principal Problem:   Primary localized osteoarthrosis of right shoulder Active Problems:   Primary localized osteoarthrosis of shoulder   Past Medical History:  Diagnosis Date  . Arthritis   . GERD (gastroesophageal reflux disease)   . History of kidney stones   . Hypertension   . Primary localized osteoarthrosis of right shoulder 10/13/2017    Surgeries: Procedure(s): RIGHT TOTAL SHOULDER ARTHROPLASTY RESECTION DISTAL CLAVICAL on 10/13/2017   Consultants (if any):   Discharged Condition: Improved  Hospital Course: Sergio Jensen is an 66 y.o. male who was admitted 10/13/2017 with a diagnosis of Primary localized osteoarthrosis of right shoulder and went to the operating room on 10/13/2017 and underwent the above named procedures.    He was given perioperative antibiotics:  Anti-infectives (From admission, onward)   Start     Dose/Rate Route Frequency Ordered Stop   10/13/17 1400  ceFAZolin (ANCEF) IVPB 1 g/50 mL premix     1 g 100 mL/hr over 30 Minutes Intravenous Every 6 hours 10/13/17 1152 10/14/17 0249   10/13/17 0541  ceFAZolin (ANCEF) IVPB 2g/100 mL premix     2 g 200 mL/hr over 30 Minutes Intravenous On call to O.R. 10/13/17 [id number] 10/13/17 0755    .  He was given sequential compression devices, early ambulation for DVT prophylaxis.  He benefited maximally from the hospital stay and there were no complications.  He recovered full neurovascular function by the morning.  Recent vital signs:  Vitals:   10/13/17 2254 10/14/17 0357  BP: 108/63 117/64  Pulse: (!) 59 68  Resp: 18 18  Temp: 98.5 F (36.9 C) 97.9 F (36.6 C)  SpO2: 96% 96%    Recent laboratory studies:  Lab Results  Component Value Date   HGB  11.6 (L) 10/14/2017   HGB 14.3 10/02/2017   Lab Results  Component Value Date   WBC 14.6 (H) 10/14/2017   PLT 172 10/14/2017   No results found for: INR Lab Results  Component Value Date   NA 138 10/02/2017   K 4.1 10/02/2017   CL 105 10/02/2017   CO2 24 10/02/2017   BUN 18 10/02/2017   CREATININE 1.09 10/02/2017   GLUCOSE 92 10/02/2017    Discharge Medications:   Allergies as of 10/14/2017      Reactions   Erythromycin Other (See Comments)   Severe stomach cramps      Medication List    STOP taking these medications   naproxen sodium 220 MG tablet Commonly known as:  ALEVE     TAKE these medications   baclofen 10 MG tablet Commonly known as:  LIORESAL Take 1 tablet (10 mg total) by mouth 3 (three) times daily. As needed for muscle spasm   esomeprazole 40 MG capsule Commonly known as:  NEXIUM Take 40 mg by mouth daily as needed.   ESTER-C 500-200-60 MG Tabs Take 500 mg by mouth daily.   irbesartan 300 MG tablet Commonly known as:  AVAPRO Take 300 mg by mouth daily.   Lysine 1000 MG Tabs Take 1,000 mg by mouth daily.   multivitamin with minerals tablet Take 1 tablet by mouth daily. Thrive   ondansetron 4 MG tablet Commonly known as:  ZOFRAN Take 1 tablet (4 mg total) by mouth every 8 (eight) hours as needed for nausea  or vomiting.   oxyCODONE 5 MG immediate release tablet Commonly known as:  ROXICODONE Take 1-2 tablets (5-10 mg total) by mouth every 4 (four) hours as needed for severe pain.   sennosides-docusate sodium 8.6-50 MG tablet Commonly known as:  SENOKOT-S Take 2 tablets by mouth daily.   testosterone cypionate 100 MG/ML injection Commonly known as:  DEPOTESTOTERONE CYPIONATE Inject 200 mg into the muscle every 14 (fourteen) days. For IM use only   Zinc Gluconate 30 MG Tabs Take 30 mg by mouth daily.       Diagnostic Studies: Dg Shoulder Right Port  Result Date: 10/13/2017 CLINICAL DATA:  66 year old male with a history of prior  surgery EXAM: PORTABLE RIGHT SHOULDER COMPARISON:  CT 05/04/2017 FINDINGS: Single frontal view of the right shoulder. Interval surgical changes of right shoulder hemiarthroplasty, with gas within the surgical bed, in the subacromial space. Density overlies the glenoid, which is incompletely visualized given the positioning. Subacromial spurring IMPRESSION: Early postoperative changes of right shoulder hemiarthroplasty. Un certain density projecting over the inferior glenoid, which is incompletely imaged given the positioning. If there is concern for further evaluation, repeat plain film may be considered. Subacromial spurring. Electronically Signed   By: Gilmer MorJaime  Wagner D.O.   On: 10/13/2017 11:27    Disposition: Final discharge disposition not confirmed    Follow-up Information    Teryl LucyLandau, Safi Culotta, MD. Schedule an appointment as soon as possible for a visit in 2 weeks.   Specialty:  Orthopedic Surgery Contact information: [address] [place] [address] [phone]            Signed: Eulas PostJoshua P Shinika Estelle 10/14/2017, 7:30 AM

## 2018-02-16 IMAGING — CT CT SHOULDER*R* W/O CM
3 series · 8 of 14 positions shown, 9 images · non-contrast
Comparison: None.

CLINICAL DATA: Right shoulder pain for several years. Limited range
of motion. Preop for shoulder replacement.

EXAM:
CT OF THE UPPER RIGHT EXTREMITY WITHOUT CONTRAST
TECHNIQUE: Multidetector CT imaging of the upper right extremity was performed
according to the standard protocol.

[Series 5: shoulder soft · axial · 0.50mm/px · z∈[-150,-78]mm · 2 of 88 slices shown, 3 images]
[im 30/88  soft-tissue]
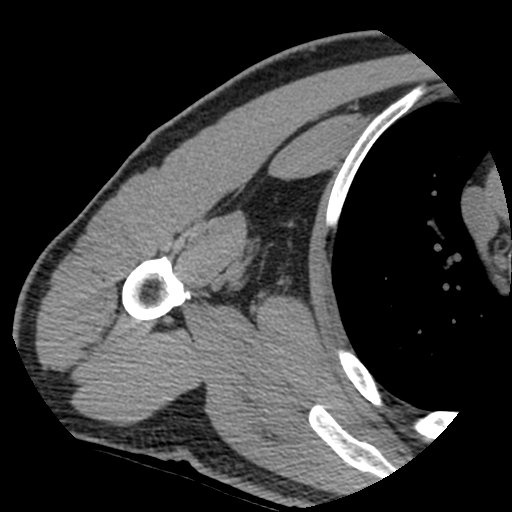
[im 30/88  bone]
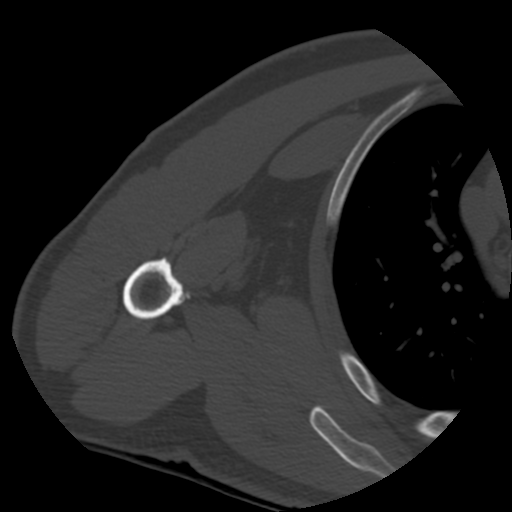
[im 59/88  bone]
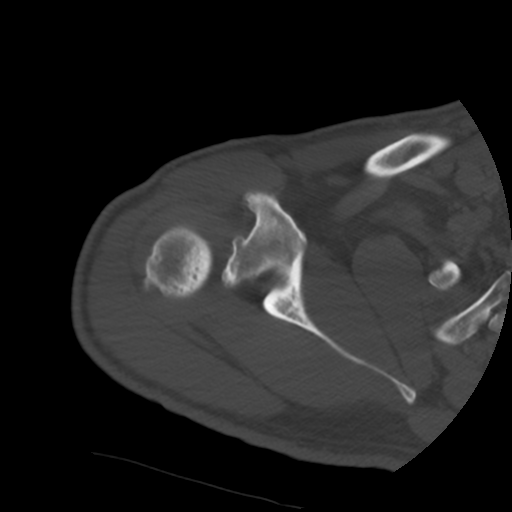

[Series 300: sag soft · sagittal · 0.50mm/px · 3 of 109 slices shown]
[im 28/109  soft-tissue]
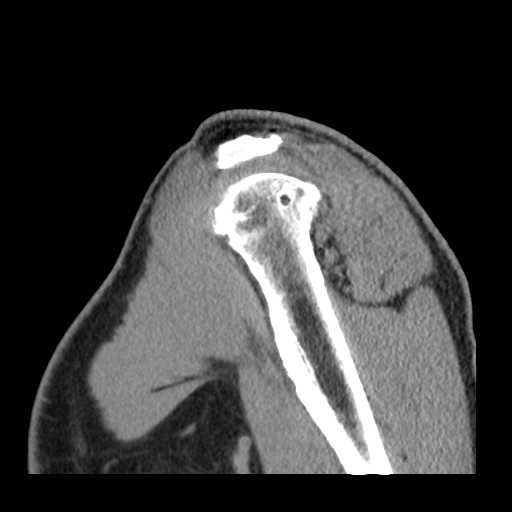
[im 55/109  soft-tissue]
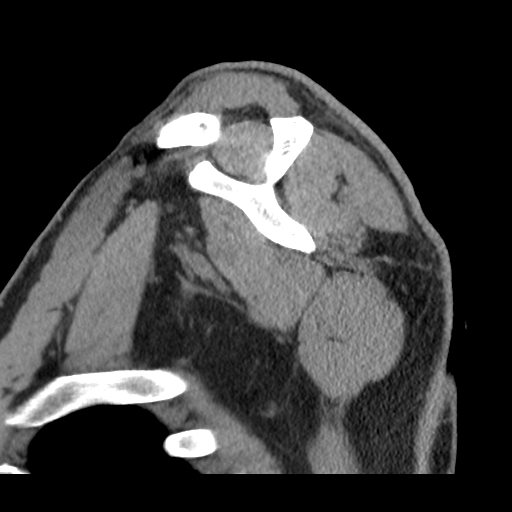
[im 82/109  soft-tissue]
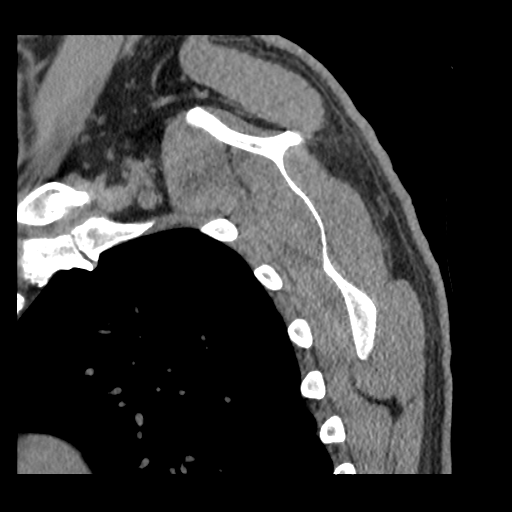

[Series 301: cor soft · coronal · 0.50mm/px · 3 of 109 slices shown]
[im 28/109  soft-tissue]
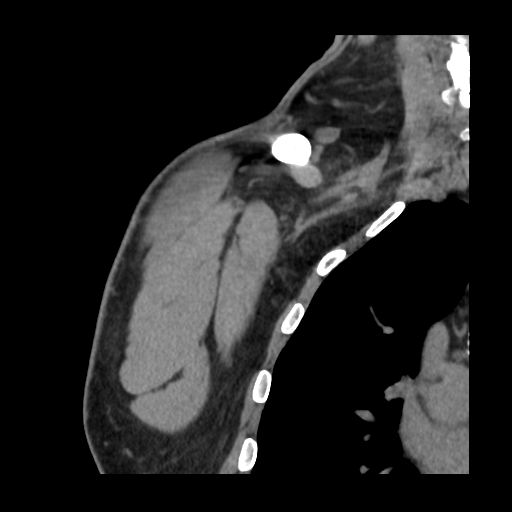
[im 55/109  soft-tissue]
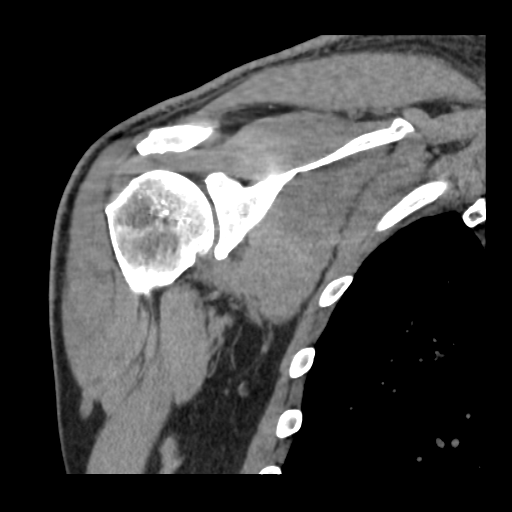
[im 82/109  soft-tissue]
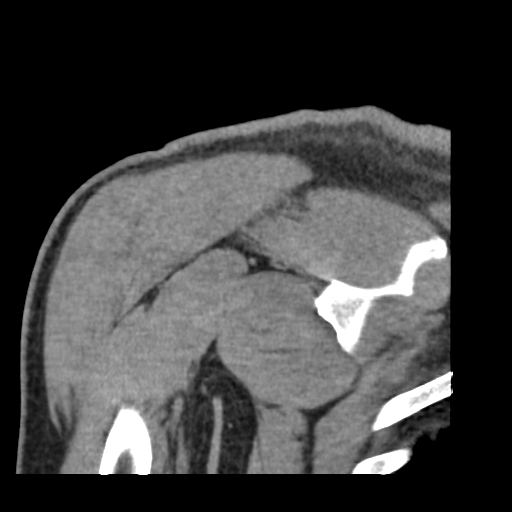

[8 of 14 positions shown; findings below may reference images not displayed]

FINDINGS: Bones/Joint/Cartilage

Subchondral degenerative cystic change across the AC and
glenohumeral articulations involving the distal clavicle and
acromion as well as glenoid and humeral head. Inferomedial
osteophyte off the humeral head at the glenohumeral joint, series
201, image 59. Type 2 acromial shape without findings of
impingement. Small cartilaginous rest or enchondroma suggested of
the medial right clavicle, series 4, image 34.

Ligaments

Suboptimally assessed by CT.

Muscles and Tendons

Soft tissue calcification of the infraspinatus tendon, series 200,
image 41 consistent with calcific tendinopathy. Similar
calcification involving the distal subscapularis tendon, series 200,
image 40.

No retracted rotator cuff tendon tear.  No muscle atrophy.

Soft tissues

The visualized adjacent longus unremarkable as are the adjacent
included ribs.
IMPRESSION: 1. Osteoarthritis of the AC and glenohumeral joints.
2. No acute fracture, bone destruction nor dislocations.
3. Rotator cuff tendinopathy involving the infraspinatus and
subscapularis tendons.
4. Small cartilaginous rest or enchondroma suggested of the medial
right clavicle.

## 2018-07-28 IMAGING — DX DG SHOULDER 2+V PORT*R*
1 series · 1 of 1 positions shown · non-contrast
Comparison: CT 05/04/2017

CLINICAL DATA: 66-year-old male with a history of prior surgery

EXAM:
PORTABLE RIGHT SHOULDER

[shoulder ap]
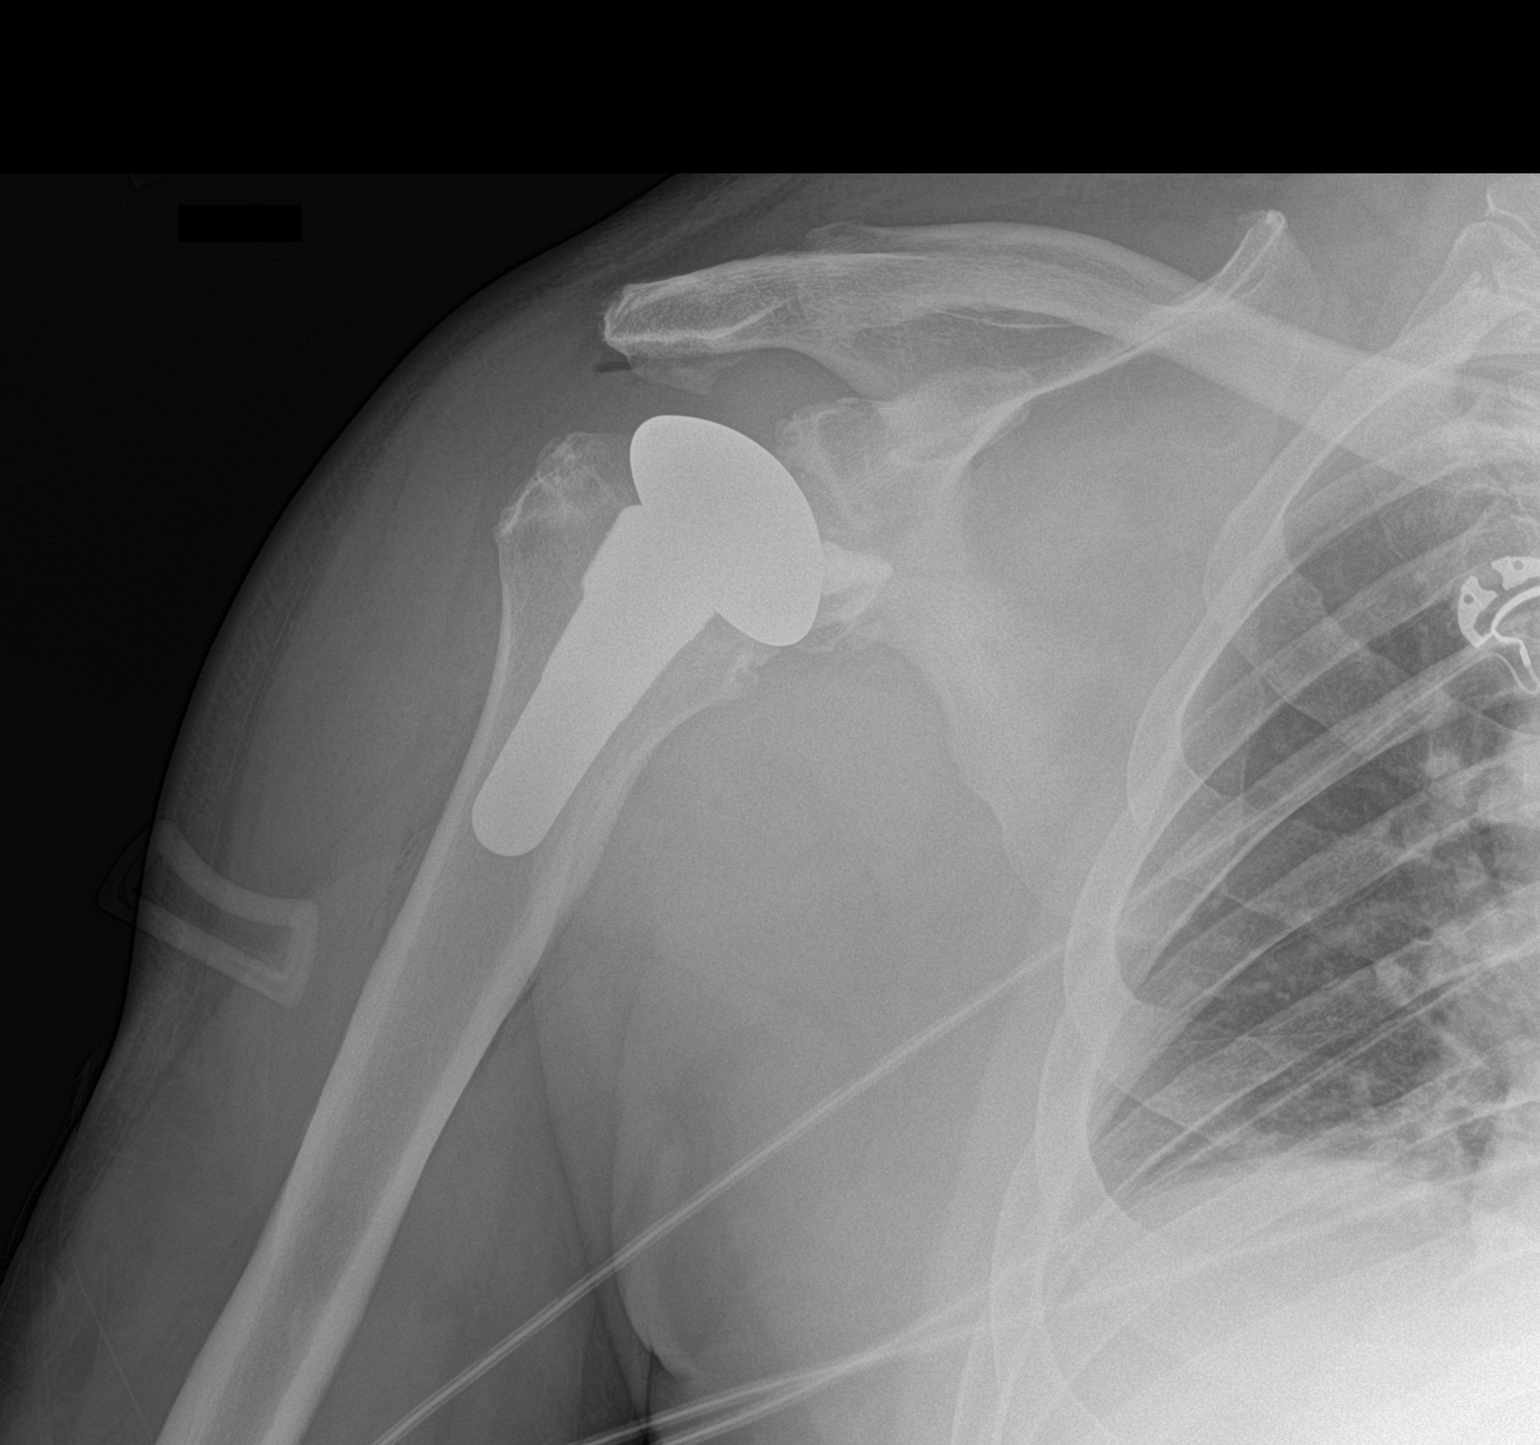

[1 of 1 positions shown; findings below may reference images not displayed]

FINDINGS: Single frontal view of the right shoulder. Interval surgical changes
of right shoulder hemiarthroplasty, with gas within the surgical
bed, in the subacromial space.

Density overlies the glenoid, which is incompletely visualized given
the positioning. Subacromial spurring
IMPRESSION: Early postoperative changes of right shoulder hemiarthroplasty.

Un certain density projecting over the inferior glenoid, which is
incompletely imaged given the positioning. If there is concern for
further evaluation, repeat plain film may be considered.

Subacromial spurring.
